# Patient Record
Sex: Female | Born: 2000 | Race: Black or African American | Hispanic: No | Marital: Single | Smoking: Never smoker
Health system: Southern US, Community
[De-identification: ages and names within clinical notes are randomized; demographics above are authoritative.]

## PROBLEM LIST (undated history)

## (undated) DIAGNOSIS — Z789 Other specified health status: Secondary | ICD-10-CM

## (undated) HISTORY — DX: Other specified health status: Z78.9

## (undated) HISTORY — PX: NO PAST SURGERIES: SHX2092

---

## 2019-09-26 LAB — AMB REFERRAL TO OB-GYN

## 2019-10-03 LAB — OB RESULTS CONSOLE GC/CHLAMYDIA
Chlamydia: NEGATIVE
Gonorrhea: NEGATIVE

## 2019-10-03 LAB — OB RESULTS CONSOLE HGB/HCT, BLOOD
HCT: 37 (ref 29–41)
Hemoglobin: 11.8

## 2019-10-03 LAB — OB RESULTS CONSOLE HEPATITIS B SURFACE ANTIGEN: Hepatitis B Surface Ag: NEGATIVE

## 2019-10-03 LAB — OB RESULTS CONSOLE ABO/RH: RH Type: POSITIVE

## 2019-10-03 LAB — OB RESULTS CONSOLE ANTIBODY SCREEN: Antibody Screen: NEGATIVE

## 2019-10-03 LAB — CYSTIC FIBROSIS DIAGNOSTIC STUDY: Interpretation-CFDNA:: NEGATIVE

## 2019-10-03 LAB — OB RESULTS CONSOLE RUBELLA ANTIBODY, IGM: Rubella: IMMUNE

## 2019-10-03 LAB — OB RESULTS CONSOLE HIV ANTIBODY (ROUTINE TESTING): HIV: NONREACTIVE

## 2019-10-03 LAB — OB RESULTS CONSOLE PLATELET COUNT: Platelets: 272

## 2019-10-03 LAB — OB RESULTS CONSOLE RPR: RPR: NONREACTIVE

## 2019-11-16 ENCOUNTER — Ambulatory Visit: Payer: Self-pay | Attending: Internal Medicine

## 2019-11-16 ENCOUNTER — Ambulatory Visit: Payer: Self-pay

## 2019-11-16 DIAGNOSIS — Z20822 Contact with and (suspected) exposure to covid-19: Secondary | ICD-10-CM

## 2019-11-17 LAB — NOVEL CORONAVIRUS, NAA: SARS-CoV-2, NAA: NOT DETECTED

## 2019-11-17 LAB — SARS-COV-2, NAA 2 DAY TAT

## 2019-12-06 ENCOUNTER — Encounter: Payer: Self-pay | Admitting: Nurse Practitioner

## 2019-12-06 ENCOUNTER — Ambulatory Visit (INDEPENDENT_AMBULATORY_CARE_PROVIDER_SITE_OTHER): Payer: Managed Care, Other (non HMO) | Admitting: Nurse Practitioner

## 2019-12-06 ENCOUNTER — Other Ambulatory Visit: Payer: Self-pay

## 2019-12-06 DIAGNOSIS — Z3480 Encounter for supervision of other normal pregnancy, unspecified trimester: Secondary | ICD-10-CM | POA: Insufficient documentation

## 2019-12-06 DIAGNOSIS — Z3402 Encounter for supervision of normal first pregnancy, second trimester: Secondary | ICD-10-CM

## 2019-12-06 DIAGNOSIS — Z3A21 21 weeks gestation of pregnancy: Secondary | ICD-10-CM

## 2019-12-06 NOTE — Patient Instructions (Signed)

## 2019-12-06 NOTE — Progress Notes (Addendum)
Subjective:   Gail Alexander is a 19 y.o. G1P0 at [redacted]w[redacted]d by LMP and confirmed by early [place] being seen today for her first obstetrical visit.  Her obstetrical history is significant for joining the practice as a transfer from [place], [place].  Some prenatal recoreds available.  Requested results from some labs ordered but not resulted in her records.. Patient does not intend to breast feed. Pregnancy history fully reviewed.  Patient reports no complaints.  HISTORY: OB History  Gravida Para Term Preterm AB Living  1 0 0 0 0 0  SAB TAB Ectopic Multiple Live Births  0 0 0 0 0    # Outcome Date GA Lbr Len/2nd Weight Sex Delivery Anes PTL Lv  1 Current            Past Medical History:  Diagnosis Date   Medical history non-contributory    Past Surgical History:  Procedure Laterality Date   NO PAST SURGERIES     History reviewed. No pertinent family history. Social History   Tobacco Use   Smoking status: Never Smoker   Smokeless tobacco: Never Used  Substance Use Topics   Alcohol use: Not Currently   Drug use: Not Currently    Types: Marijuana   Not on File Current Outpatient Medications on File Prior to Visit  Medication Sig Dispense Refill   Prenatal Vit-Fe Fumarate-FA (PRENATAL MULTIVITAMIN) TABS tablet Take 1 tablet by mouth daily at 12 noon.     No current facility-administered medications on file prior to visit.     Exam   Vitals:   12/06/19 0919 12/06/19 0957  BP: 108/61   Pulse: 92   Weight: 151 lb 11.2 oz (68.8 kg)   Height:  4\' 11"  (1.499 m)   Fetal Heart Rate (bpm): 151  Uterus:   at the level of the umbilicus  Pelvic Exam: Perineum: deferred   Vulva:    Vagina:     Cervix:    Adnexa:    Bony Pelvis:   System: General: well-developed, well-nourished female in no acute distress   Breast:  deferred   Skin: normal coloration and turgor, no rashes   Neurologic: oriented, normal, negative, normal mood   Extremities: normal strength, tone, and  muscle mass, ROM of all joints is normal   HEENT extraocular movement intact and sclera clear, anicteric   Mouth/Teeth deferred   Neck supple and no masses, normal thyroid   Cardiovascular: regular rate and rhythm   Respiratory:  no respiratory distress, normal breath sounds   Abdomen: soft, non-tender; no masses,  no organomegaly     Assessment:   Pregnancy: G1P0 Patient Active Problem List   Diagnosis Date Noted   Supervision of other normal pregnancy, antepartum 12/06/2019     Plan:  1. Supervision of other normal pregnancy, antepartum Feeling movement Reviewed MyChart and Babyscripts apps Reviewed importance of childbirth, breastfeeding classes.  Advised to take water birth class if interested in water birth. Non smoker Stopped marijuana in the pregnancy Denies any nausea or vomiting Prenatal records reviewed - prenatal labs drawn - no results seen in the records except for negative GC/Chlam on 09-26-19  - [place] MFM OB COMP + 58 WK; Future - [facility] AMB BABYSCRIPTS SCHEDULE OPTIMIZATION   Initial lab results reviewed in prenatal records. Continue prenatal vitamins. Genetic Screening discussed, horizon, panorama and afp and NIPS: done in previous practice - results requested. Ultrasound discussed; fetal anatomic survey: ordered. Problem list reviewed and updated. The nature of [facility] with multiple MDs and other Advanced Practice Providers was explained to patient; also emphasized that residents, students are part of our team. Routine obstetric precautions reviewed. Return in about 4 weeks (around 01/03/2020) for virtual visit in 4 weeks and please schedule anatomy US.  Total face-to-face time with patient: 40 minutes.  Over 50% of encounter was spent on counseling and coordination of care.     Nolene Bernheim, FNP Family Nurse Practitioner, [facility] for [employer], [facility] 12/06/2019 10:16 AM

## 2019-12-06 NOTE — Progress Notes (Signed)
151

## 2019-12-07 ENCOUNTER — Encounter: Payer: Self-pay | Admitting: *Deleted

## 2019-12-07 ENCOUNTER — Telehealth: Payer: Self-pay | Admitting: Nurse Practitioner

## 2019-12-07 NOTE — Telephone Encounter (Signed)
Attempted to contact patient to get her to come by the office to sign a release of information or contact her old doctor's office to send over her lab work that was not received at our office. No answer, left voicemail for patient with the above information. Our fax number was given.

## 2019-12-16 ENCOUNTER — Other Ambulatory Visit: Payer: Self-pay

## 2019-12-16 ENCOUNTER — Other Ambulatory Visit: Payer: Self-pay | Admitting: *Deleted

## 2019-12-16 ENCOUNTER — Ambulatory Visit: Payer: Managed Care, Other (non HMO) | Admitting: *Deleted

## 2019-12-16 ENCOUNTER — Other Ambulatory Visit: Payer: Self-pay | Admitting: Nurse Practitioner

## 2019-12-16 ENCOUNTER — Ambulatory Visit: Payer: Managed Care, Other (non HMO) | Attending: Obstetrics and Gynecology

## 2019-12-16 DIAGNOSIS — Z363 Encounter for antenatal screening for malformations: Secondary | ICD-10-CM | POA: Diagnosis not present

## 2019-12-16 DIAGNOSIS — Z3480 Encounter for supervision of other normal pregnancy, unspecified trimester: Secondary | ICD-10-CM

## 2019-12-16 DIAGNOSIS — Z362 Encounter for other antenatal screening follow-up: Secondary | ICD-10-CM

## 2019-12-16 DIAGNOSIS — Z3A23 23 weeks gestation of pregnancy: Secondary | ICD-10-CM

## 2019-12-18 ENCOUNTER — Encounter: Payer: Self-pay | Admitting: Nurse Practitioner

## 2019-12-18 DIAGNOSIS — O09899 Supervision of other high risk pregnancies, unspecified trimester: Secondary | ICD-10-CM | POA: Insufficient documentation

## 2019-12-29 ENCOUNTER — Telehealth (INDEPENDENT_AMBULATORY_CARE_PROVIDER_SITE_OTHER): Payer: Managed Care, Other (non HMO) | Admitting: Lactation Services

## 2019-12-29 DIAGNOSIS — Z3480 Encounter for supervision of other normal pregnancy, unspecified trimester: Secondary | ICD-10-CM

## 2019-12-29 NOTE — Telephone Encounter (Signed)
Returned patients call.   Patient reports she started having diarrhea at 2 am this morning. She was having stomach cramps and some nausea. She felt weak and had a headache on the left back of her skull.   She reports she took some Tylenol and the headache went away. She reports she does not have diarrhea and longer, she is still nauseous/queasy. She has not vomited.  Reviewed she may have eaten something that didn't sit well on her stomach or have a stomach bug.  Reviewed eating small meals and sips of water or Gatorade. Reviewed it may take a few days for her to get over this.   Discussed if she has uncontrolled vomiting and diarrhea and cannot keep food or liquid then she can go to the MAU for evaluation. Patient voiced understanding.

## 2020-01-04 ENCOUNTER — Other Ambulatory Visit: Payer: Self-pay

## 2020-01-04 ENCOUNTER — Encounter: Payer: Self-pay | Admitting: Certified Nurse Midwife

## 2020-01-04 ENCOUNTER — Telehealth (INDEPENDENT_AMBULATORY_CARE_PROVIDER_SITE_OTHER): Payer: Managed Care, Other (non HMO) | Admitting: Certified Nurse Midwife

## 2020-01-04 DIAGNOSIS — O09899 Supervision of other high risk pregnancies, unspecified trimester: Secondary | ICD-10-CM

## 2020-01-04 DIAGNOSIS — Z3480 Encounter for supervision of other normal pregnancy, unspecified trimester: Secondary | ICD-10-CM

## 2020-01-04 DIAGNOSIS — O368920 Maternal care for other specified fetal problems, second trimester, not applicable or unspecified: Secondary | ICD-10-CM

## 2020-01-04 DIAGNOSIS — Z3A25 25 weeks gestation of pregnancy: Secondary | ICD-10-CM

## 2020-01-04 NOTE — Patient Instructions (Signed)
Glucose Tolerance Test During Pregnancy Why am I having this test? The glucose tolerance test (GTT) is done to check how your body processes sugar (glucose). This is one of several tests used to diagnose diabetes that develops during pregnancy (gestational diabetes mellitus). Gestational diabetes is a temporary form of diabetes that some women develop during pregnancy. It usually occurs during the second trimester of pregnancy and goes away after delivery. Testing (screening) for gestational diabetes usually occurs between 24 and 28 weeks of pregnancy. You may have the GTT test after having a 1-hour glucose screening test if the results from that test indicate that you may have gestational diabetes. You may also have this test if:  You have a history of gestational diabetes.  You have a history of giving birth to very large babies or have experienced repeated fetal loss (stillbirth).  You have signs and symptoms of diabetes, such as: ? Changes in your vision. ? Tingling or numbness in your hands or feet. ? Changes in hunger, thirst, and urination that are not otherwise explained by your pregnancy. What is being tested? This test measures the amount of glucose in your blood at different times during a period of 3 hours. This indicates how well your body is able to process glucose. What kind of sample is taken?  Blood samples are required for this test. They are usually collected by inserting a needle into a blood vessel. How do I prepare for this test?  For 3 days before your test, eat normally. Have plenty of carbohydrate-rich foods.  Follow instructions from your health care provider about: ? Eating or drinking restrictions on the day of the test. You may be asked to not eat or drink anything other than water (fast) starting 8-10 hours before the test. ? Changing or stopping your regular medicines. Some medicines may interfere with this test. Tell a health care provider about:  All  medicines you are taking, including vitamins, herbs, eye drops, creams, and over-the-counter medicines.  Any blood disorders you have.  Any surgeries you have had.  Any medical conditions you have. What happens during the test? First, your blood glucose will be measured. This is referred to as your fasting blood glucose, since you fasted before the test. Then, you will drink a glucose solution that contains a certain amount of glucose. Your blood glucose will be measured again 1, 2, and 3 hours after drinking the solution. This test takes about 3 hours to complete. You will need to stay at the testing location during this time. During the testing period:  Do not eat or drink anything other than the glucose solution.  Do not exercise.  Do not use any products that contain nicotine or tobacco, such as cigarettes and e-cigarettes. If you need help stopping, ask your health care provider. The testing procedure may vary among health care providers and hospitals. How are the results reported? Your results will be reported as milligrams of glucose per deciliter of blood (mg/dL) or millimoles per liter (mmol/L). Your health care provider will compare your results to normal ranges that were established after testing a large group of people (reference ranges). Reference ranges may vary among labs and hospitals. For this test, common reference ranges are:  Fasting: less than 95-105 mg/dL (5.3-5.8 mmol/L).  1 hour after drinking glucose: less than 180-190 mg/dL (10.0-10.5 mmol/L).  2 hours after drinking glucose: less than 155-165 mg/dL (8.6-9.2 mmol/L).  3 hours after drinking glucose: 140-145 mg/dL (7.8-8.1 mmol/L). What do the   results mean? Results within reference ranges are considered normal, meaning that your glucose levels are well-controlled. If two or more of your blood glucose levels are high, you may be diagnosed with gestational diabetes. If only one level is high, your health care  provider may suggest repeat testing or other tests to confirm a diagnosis. Talk with your health care provider about what your results mean. Questions to ask your health care provider Ask your health care provider, or the department that is doing the test:  When will my results be ready?  How will I get my results?  What are my treatment options?  What other tests do I need?  What are my next steps? Summary  The glucose tolerance test (GTT) is one of several tests used to diagnose diabetes that develops during pregnancy (gestational diabetes mellitus). Gestational diabetes is a temporary form of diabetes that some women develop during pregnancy.  You may have the GTT test after having a 1-hour glucose screening test if the results from that test indicate that you may have gestational diabetes. You may also have this test if you have any symptoms or risk factors for gestational diabetes.  Talk with your health care provider about what your results mean. This information is not intended to replace advice given to you by your health care provider. Make sure you discuss any questions you have with your health care provider. Document Revised: 11/04/2018 Document Reviewed: 02/23/2017 Elsevier Patient Education  2020 Elsevier Inc.  

## 2020-01-04 NOTE — Progress Notes (Signed)
OBSTETRICS PRENATAL VIRTUAL VISIT ENCOUNTER NOTE  Provider location: [facility]   I connected with Dub Amis on 01/04/20 at  1:35 PM EDT by MyChart Video Encounter at home and verified that I am speaking with the correct person using two identifiers.   I discussed the limitations, risks, security and privacy concerns of performing an evaluation and management service virtually and the availability of in person appointments. I also discussed with the patient that there may be a patient responsible charge related to this service. The patient expressed understanding and agreed to proceed. Subjective:  Gail Alexander is a 19 y.o. G1P0 at [redacted]w[redacted]d being seen today for ongoing prenatal care.  She is currently monitored for the following issues for this high-risk pregnancy and has Supervision of other normal pregnancy, antepartum and Two vessel umbilical cord, antepartum, unspecified trimester on their problem list.  Patient reports no complaints.  Contractions: Not present. Vag. Bleeding: None.  Movement: Present. Denies any leaking of fluid.   The following portions of the patient's history were reviewed and updated as appropriate: allergies, current medications, past family history, past medical history, past social history, past surgical history and problem list.   Objective:  There were no vitals filed for this visit.  Fetal Status:     Movement: Present     General:  Alert, oriented and cooperative. Patient is in no acute distress.  Respiratory: Normal respiratory effort, no problems with respiration noted  Mental Status: Normal mood and affect. Normal behavior. Normal judgment and thought content.  Rest of physical exam deferred due to type of encounter  Imaging: [place] MFM OB DETAIL +14 WK  Result Date: 12/16/2019 ----------------------------------------------------------------------  OBSTETRICS REPORT                       (Signed Final  12/16/2019 09:26 am) ---------------------------------------------------------------------- Patient Info  ID #:       [id number]                          D.O.B.:  2001/01/31 (19 yrs)  Name:       Gail Alexander                  Visit Date: 12/16/2019 08:17 am ---------------------------------------------------------------------- Performed By  Attending:        Ma Rings MD         Ref. Address:     [address]                                                             [place], [place]                                                             [address]  Performed By:     Percell Boston          [facility]  Referred By:      [facility] ---------------------------------------------------------------------- Orders  #  Description                           Code        Ordered By  1  [place] MFM OB DETAIL +14 WK               76811.01    Nolene Bernheim ----------------------------------------------------------------------  #  Order #                     Accession #                Episode #  1  [id number]                   [id number]                 [id number] ---------------------------------------------------------------------- Indications  2 vessel umbilical cord                        O69.89X0  [redacted] weeks gestation of pregnancy                Z3A.23  Encounter for antenatal screening for          Z36.3  malformations  No genetic testing in chart ---------------------------------------------------------------------- Fetal Evaluation  Num Of Fetuses:         1  Fetal Heart Rate(bpm):  136  Cardiac Activity:       Observed  Presentation:           Breech  Placenta:               Anterior  P. Cord Insertion:      Visualized, central  Amniotic Fluid  AFI FV:      Within normal limits                              Largest Pocket(cm)                              6.1  ---------------------------------------------------------------------- Biometry  BPD:      55.5  mm     G. Age:  22w 6d         37  %    CI:        75.27   %    70 - 86                                                          FL/HC:      18.0   %    19.2 - 20.8  HC:      202.9  mm     G. Age:  22w 3d  12  %    HC/AC:      1.14        1.05 - 1.21  AC:      178.1  mm     G. Age:  22w 5d         27  %    FL/BPD:     65.8   %    71 - 87  FL:       36.5  mm     G. Age:  21w 4d        4.7  %    FL/AC:      20.5   %    20 - 24  HUM:      33.5  mm     G. Age:  21w 2d          6  %  CER:      24.1  mm     G. Age:  22w 1d         34  %  CM:        3.6  mm  Est. FW:     489  gm      1 lb 1 oz     11  % ---------------------------------------------------------------------- OB History  Gravidity:    1         Term:   0        Prem:   0        SAB:   0  TOP:          0       Ectopic:  0        Living: 0 ---------------------------------------------------------------------- Gestational Age  LMP:           23w 1d        Date:  07/07/19                 EDD:   04/12/20  U/S Today:     22w 3d                                        EDD:   04/17/20  Best:          23w 1d     Det. By:  LMP  (07/07/19)          EDD:   04/12/20 ---------------------------------------------------------------------- Anatomy  Cranium:               Appears normal         LVOT:                   Appears normal  Cavum:                 Appears normal         Aortic Arch:            Appears normal  Ventricles:            Appears normal         Ductal Arch:            Appears normal  Choroid Plexus:        Appears normal         Diaphragm:              Appears normal  Cerebellum:  Appears normal         Stomach:                Appears normal, left                                                                        sided  Posterior Fossa:       Appears normal         Abdomen:                Appears normal  Nuchal Fold:           Not applicable  (>20    Abdominal Wall:         Appears nml (cord                         wks GA)                                        insert, abd wall)  Face:                  Appears normal         Cord Vessels:           2 vessel cord,                         (orbits and profile)                                                                        absent right umb a  Lips:                  Appears normal         Kidneys:                Appear normal  Palate:                Not well visualized    Bladder:                Appears normal  Thoracic:              Appears normal         Spine:                  Appears normal  Heart:                 Appears normal         Upper Extremities:      Appears normal                         (4CH, axis, and  situs)  RVOT:                  Appears normal         Lower Extremities:      Appears normal  Other:  Fetus appears to be a female. Heels visualized. Technically difficult due          to fetal position. Lt hand visualized. ---------------------------------------------------------------------- Cervix Uterus Adnexa  Cervix  Normal appearance by transabdominal scan.  Uterus  No abnormality visualized.  Right Ovary  No adnexal mass visualized.  Left Ovary  No adnexal mass visualized.  Cul De Sac  No free fluid seen.  Adnexa  No abnormality visualized. ---------------------------------------------------------------------- Comments  This patient was seen for a detailed fetal anatomy scan as  she recently transferred her care from a practice in [place].  She denies any significant past medical history and denies  any problems in her current pregnancy.  She reports that she had a cell free DNA test earlier in her  pregnancy which indicated a low risk for trisomy 6921, 2718, and  13. A female fetus is predicted.  The results of this test was not  available in her chart today.  She was informed that the fetal growth and amniotic fluid  level were appropriate for her  gestational age.  A two-vessel umbilical cord was noted on today's exam.  The implications and management of a two-vessel umbilical  cord was discussed in detail with the patient today.  She was  advised regarding the small association of trisomy 5718 with a  two-vessel cord. The patient was reassured that based on her  negative cell free DNA test and as there were no other  anomalies noted on today's exam, that it is highly unlikely that  her baby will have trisomy 18.  Due to the small possibility of trisomy 18, the patient was  offered and declined an amniocentesis today for definitive  diagnosis of fetal aneuploidy. The small risk of fetal growth  issues later in her pregnancy due to the two-vessel cord was  also discussed today.  Due to the two-vessel umbilical cord noted today, we will  continue to follow her with serial growth ultrasounds.  The  patient was reassured that the two-vessel cord is most likely  a normal variant and that her baby will most likely not be  affected by this finding after delivery.  The patient was informed that anomalies may be missed due  to technical limitations. If the fetus is in a suboptimal position  or maternal habitus is increased, visualization of the fetus in  the maternal uterus may be impaired.  A follow-up exam was scheduled in 3 weeks to assess the  fetal growth and to obtain better views of the fetal heart. ----------------------------------------------------------------------                   Ma RingsVictor Fang, MD Electronically Signed Final Report   12/16/2019 09:26 am ----------------------------------------------------------------------   Assessment and Plan:  Pregnancy: G1P0 at 937w6d 1. Supervision of other normal pregnancy, antepartum - Patient doing well, no complaints - patient plans on moving back to [place] on [place], [place] [facility] [place] [facility]. Encouraged to sign release of records form so that prenatal records can be sent.  - Patient unable to  take BP today d/t blood pressure cuff being at another house. Encouraged to take BP once she gets to cuff and enter into babyscripts app, if unable to take today  then we will take BP tomorrow while at [place] appointment  - Educated and discussed GTT to be at next appointment, will be done in [place].  - Preterm labor and reasons to be evaluated   2. Two vessel umbilical cord, antepartum, unspecified trimester - Follow up appointment for fetal growth scheduled for tomorrow  - patient needs serial fetal growth ultrasounds until delivery    Preterm labor symptoms and general obstetric precautions including but not limited to vaginal bleeding, contractions, leaking of fluid and fetal movement were reviewed in detail with the patient. I discussed the assessment and treatment plan with the patient. The patient was provided an opportunity to ask questions and all were answered. The patient agreed with the plan and demonstrated an understanding of the instructions. The patient was advised to call back or seek an in-person office evaluation/go to MA[facility] at [facility] for any urgent or concerning symptoms. Please refer to After Visit Summary for other counseling recommendations.   I provided 8 minutes of face-to-face time during this encounter.   Future Appointments  Date Time Provider [place]  01/05/2020  7:30 AM [facility] US3 [facility] [place], [place] for [employer], [place]

## 2020-01-05 ENCOUNTER — Other Ambulatory Visit: Payer: Self-pay | Admitting: Obstetrics

## 2020-01-05 ENCOUNTER — Ambulatory Visit: Payer: Managed Care, Other (non HMO) | Attending: Obstetrics

## 2020-01-05 ENCOUNTER — Other Ambulatory Visit: Payer: Self-pay

## 2020-01-05 DIAGNOSIS — O365920 Maternal care for other known or suspected poor fetal growth, second trimester, not applicable or unspecified: Secondary | ICD-10-CM

## 2020-01-05 DIAGNOSIS — Z3480 Encounter for supervision of other normal pregnancy, unspecified trimester: Secondary | ICD-10-CM | POA: Diagnosis present

## 2020-01-05 DIAGNOSIS — Z362 Encounter for other antenatal screening follow-up: Secondary | ICD-10-CM

## 2020-01-05 DIAGNOSIS — Z3A26 26 weeks gestation of pregnancy: Secondary | ICD-10-CM | POA: Diagnosis not present

## 2020-01-06 ENCOUNTER — Ambulatory Visit: Payer: Managed Care, Other (non HMO)

## 2020-01-24 ENCOUNTER — Encounter: Payer: Self-pay | Admitting: *Deleted

## 2020-02-06 ENCOUNTER — Encounter: Payer: Self-pay | Admitting: *Deleted

## 2021-05-24 IMAGING — US US MFM OB DETAIL+14 WK
1 series · 13 of 28 positions shown · non-contrast
Comparison: none

[Series 1: us mfm ob detail+14 wk · 117 acquisitions, 13 frames shown]
[im 5/117]
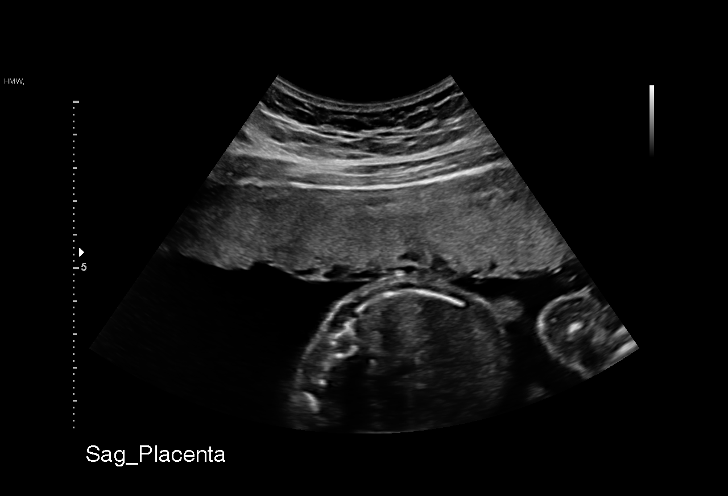
[im 13/117]
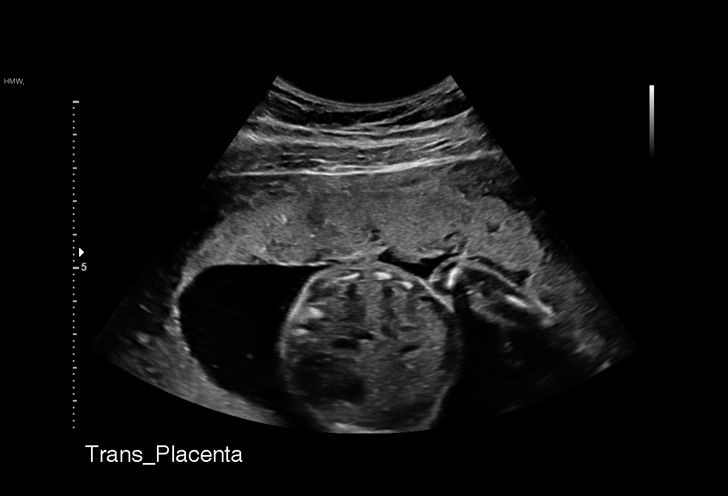
[im 22/117]
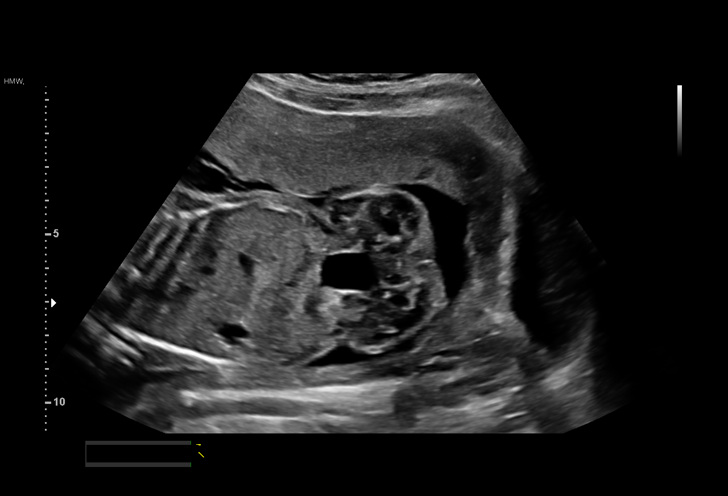
[im 31/117]
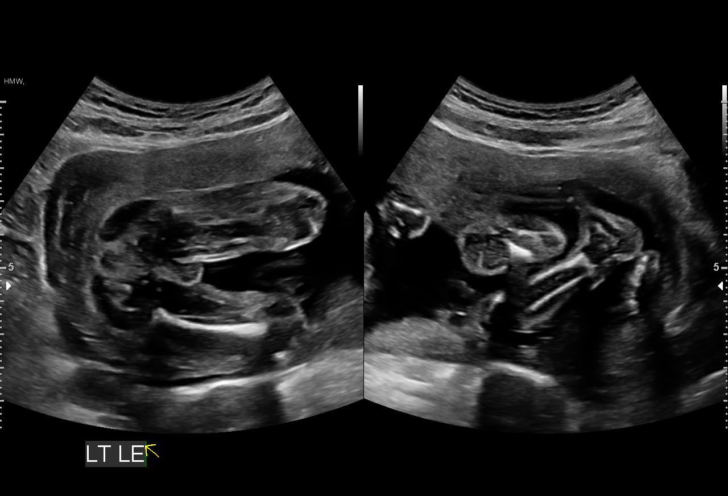
[im 39/117]
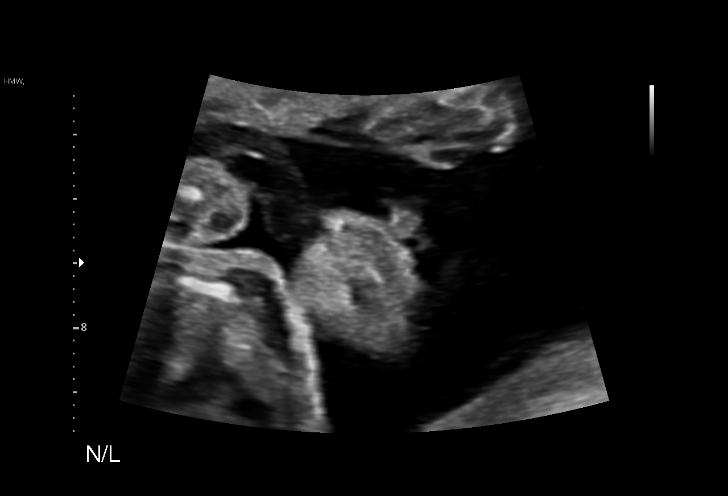
[im 48/117]
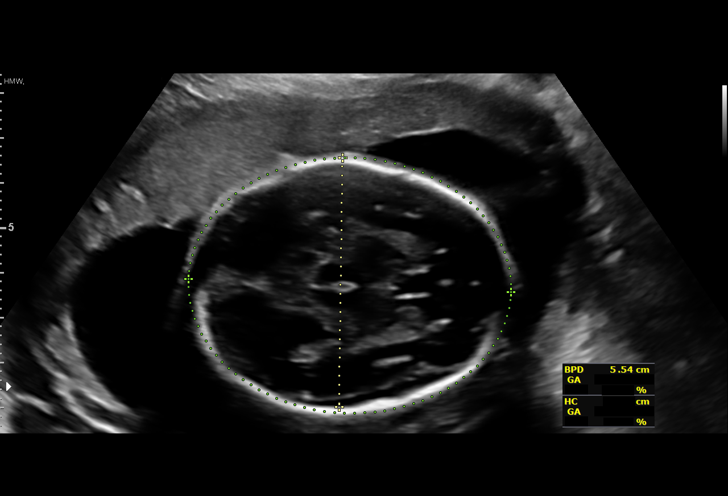
[im 61/117]
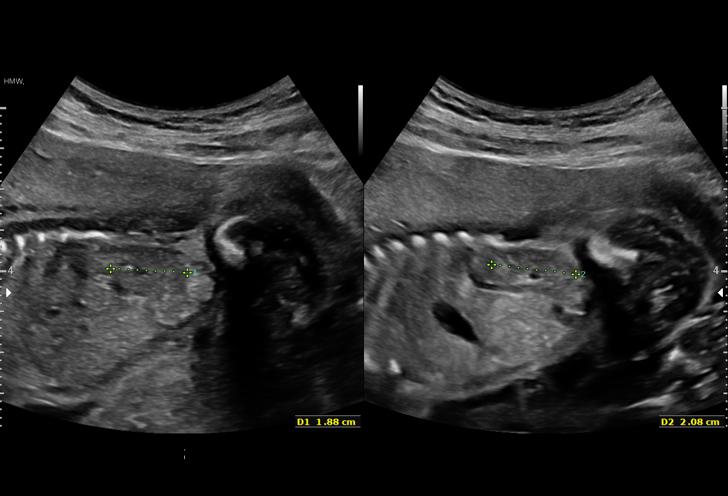
[im 69/117]
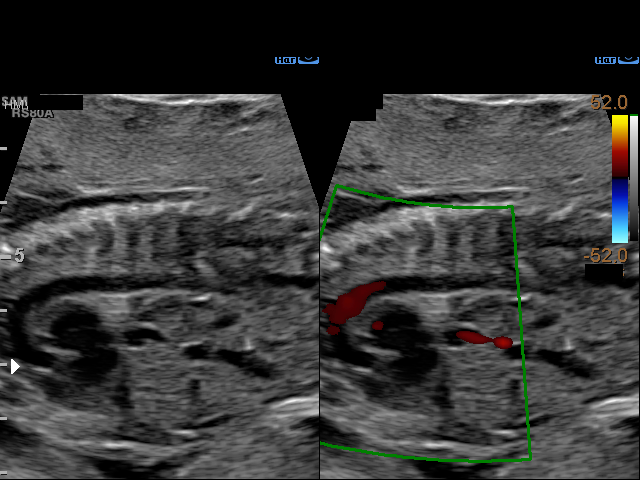
[im 78/117]
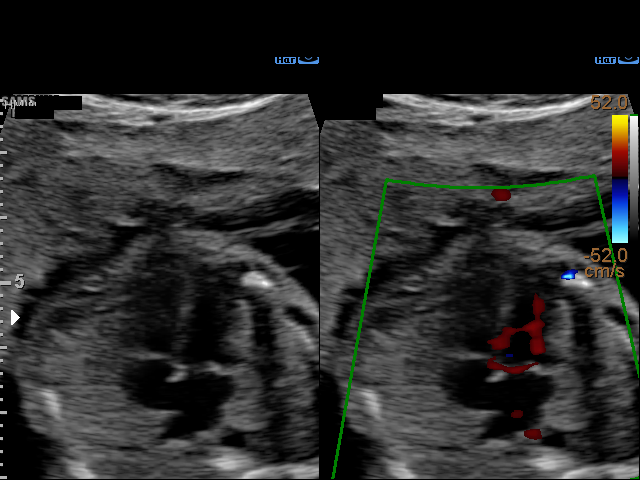
[im 86/117]
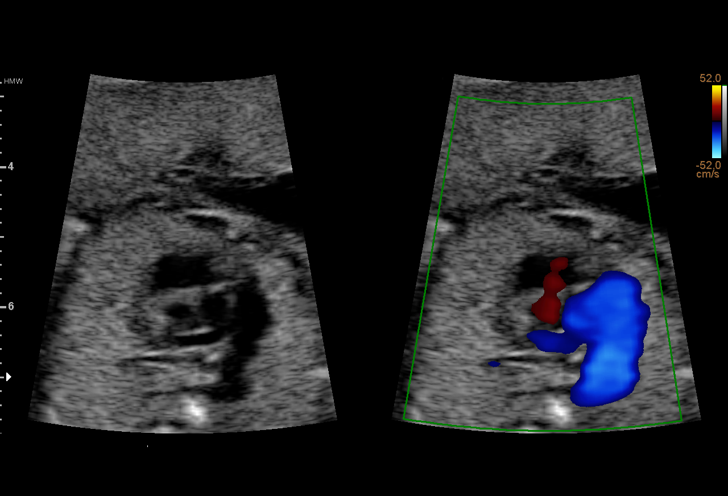
[im 95/117]
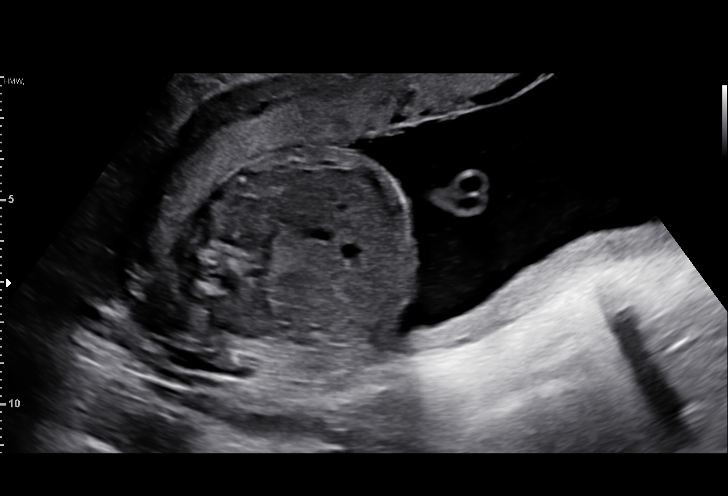
[im 104/117]
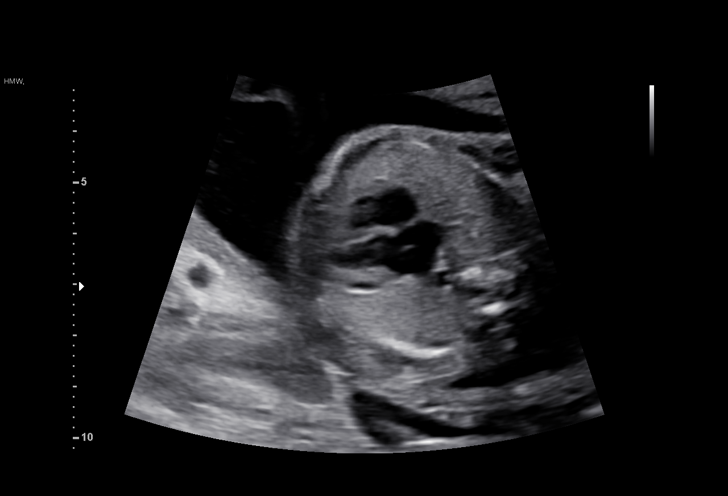
[im 112/117]
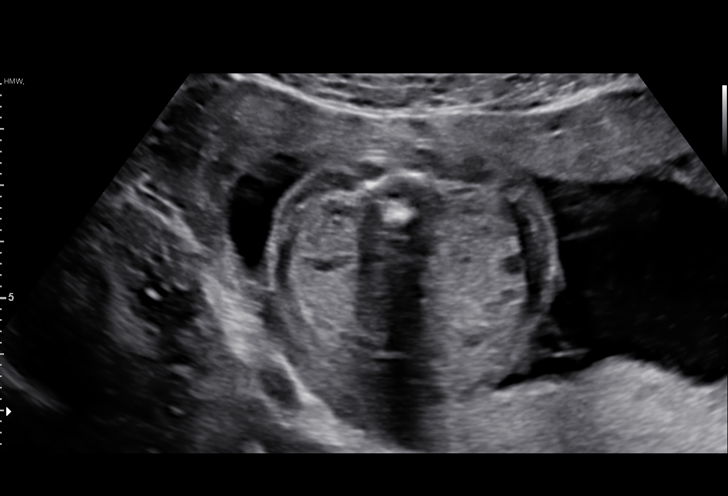

[13 of 28 positions shown; findings below may reference images not displayed]

Indications

 2 vessel umbilical cord
 23 weeks gestation of pregnancy
 Encounter for antenatal screening for
 malformations
 No genetic testing in chart
Fetal Evaluation

 Num Of Fetuses:         1
 Fetal Heart Rate(bpm):  136
 Cardiac Activity:       Observed
 Presentation:           Breech
 Placenta:               Anterior
 P. Cord Insertion:      Visualized, central

 Amniotic Fluid
 AFI FV:      Within normal limits

                             Largest Pocket(cm)

Biometry

 BPD:      55.5  mm     G. Age:  22w 6d         37  %    CI:        75.27   %    70 - 86
                                                         FL/HC:      18.0   %    19.2 -
 HC:      202.9  mm     G. Age:  22w 3d         12  %    HC/AC:      1.14        1.05 -
 AC:      178.1  mm     G. Age:  22w 5d         27  %    FL/BPD:     65.8   %    71 - 87
 FL:       36.5  mm     G. Age:  21w 4d        4.7  %    FL/AC:      20.5   %    20 - 24
 HUM:      33.5  mm     G. Age:  21w 2d          6  %
 CER:      24.1  mm     G. Age:  22w 1d         34  %

 CM:        3.6  mm
 Est. FW:     489  gm      1 lb 1 oz     11  %
OB History

 Gravidity:    1         Term:   0        Prem:   0        SAB:   0
 TOP:          0       Ectopic:  0        Living: 0
Gestational Age

 LMP:           23w 1d        Date:  07/07/19                 EDD:   04/12/20
 U/S Today:     22w 3d                                        EDD:   04/17/20
 Best:          23w 1d     Det. By:  LMP  (07/07/19)          EDD:   04/12/20
Anatomy

 Cranium:               Appears normal         LVOT:                   Appears normal
 Cavum:                 Appears normal         Aortic Arch:            Appears normal
 Ventricles:            Appears normal         Ductal Arch:            Appears normal
 Choroid Plexus:        Appears normal         Diaphragm:              Appears normal
 Cerebellum:            Appears normal         Stomach:                Appears normal, left
                                                                       sided
 Posterior Fossa:       Appears normal         Abdomen:                Appears normal
 Nuchal Fold:           Not applicable (>20    Abdominal Wall:         Appears nml (cord
                        wks GA)                                        insert, abd wall)
 Face:                  Appears normal         Cord Vessels:           2 vessel cord,
                        (orbits and profile)
                                                                       absent right [place]
 Lips:                  Appears normal         Kidneys:                Appear normal
 Palate:                Not well visualized    Bladder:                Appears normal
 Thoracic:              Appears normal         Spine:                  Appears normal
 Heart:                 Appears normal         Upper Extremities:      Appears normal
                        (4CH, axis, and
                        situs)
 RVOT:                  Appears normal         Lower Extremities:      Appears normal

 Other:  Fetus appears to be a male. Heels visualized. Technically difficult due
         to fetal position. Lt hand visualized.
Cervix Uterus Adnexa

 Cervix
 Normal appearance by transabdominal scan.

 Uterus
 No abnormality visualized.

 Right Ovary
 No adnexal mass visualized.

 Left Ovary
 No adnexal mass visualized.

 Cul De Sac
 No free fluid seen.

 Adnexa
 No abnormality visualized.
Comments

 This patient was seen for a detailed fetal anatomy scan as
 she recently transferred her care from a practice in [place].
 She denies any significant past medical history and denies
 any problems in her current pregnancy.
 She reports that she had a cell free DNA test earlier in her
 pregnancy which indicated a low risk for trisomy 21, 18, and
 13. A male fetus is predicted.  The results of this test was not
 available in her chart today.
 She was informed that the fetal growth and amniotic fluid
 level were appropriate for her gestational age.
 A two-vessel umbilical cord was noted on today's exam.
 The implications and management of a two-vessel umbilical
 cord was discussed in detail with the patient today.  She was
 advised regarding the small association of trisomy 18 with a
 two-vessel cord. The patient was reassured that based on her
 negative cell free DNA test and as there were no other
 anomalies noted on today's exam, that it is highly unlikely that
 her baby will have trisomy 18.
 Due to the small possibility of trisomy 18, the patient was
 offered and declined an amniocentesis today for definitive
 diagnosis of fetal aneuploidy. The small risk of fetal growth
 issues later in her pregnancy due to the two-vessel cord was
 also discussed today.
 Due to the two-vessel umbilical cord noted today, we will
 continue to follow her with serial growth ultrasounds.  The
 patient was reassured that the two-vessel cord is most likely
 a normal variant and that her baby will most likely not be
 affected by this finding after delivery.
 The patient was informed that anomalies may be missed due
 to technical limitations. If the fetus is in a suboptimal position
 or maternal habitus is increased, visualization of the fetus in
 the maternal uterus may be impaired.
 A follow-up exam was scheduled in 3 weeks to assess the
 fetal growth and to obtain better views of the fetal heart.
